# Patient Record
Sex: Male | Born: 1985 | Race: White | Hispanic: No | Marital: Married | Smoking: Current every day smoker
Health system: Southern US, Community
[De-identification: ages and names within clinical notes are randomized; demographics above are authoritative.]

## PROBLEM LIST (undated history)

## (undated) HISTORY — PX: HERNIA REPAIR: SHX51

---

## 2013-04-27 ENCOUNTER — Ambulatory Visit: Payer: Self-pay | Admitting: Internal Medicine

## 2013-04-27 VITALS — BP 140/90 | HR 74 | Temp 98.8°F | Resp 16 | Ht 72.25 in | Wt 220.0 lb

## 2013-04-27 DIAGNOSIS — Z0289 Encounter for other administrative examinations: Secondary | ICD-10-CM

## 2013-04-27 NOTE — Progress Notes (Signed)
Subjective:    Patient ID: Robert Roberson, male    DOB: [birth year], 27 y.o.   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 He appears well-developed and well-nourished.  HENT:  Head: Normocephalic.  Right Ear: External ear normal.  Left Ear: External ear normal.  Nose: Nose normal.  Mouth/Throat: Oropharynx is clear and moist.  Tms and canals clear  Eyes: Conjunctivae and EOM are normal. Pupils are equal, round, and reactive to light.  Neck: Normal range of motion. Neck supple. No thyromegaly present.  Cardiovascular: Normal rate, regular rhythm, normal heart sounds and intact distal pulses.   No murmur heard. Pulmonary/Chest: Effort normal and breath sounds normal. No respiratory distress. He has no wheezes. He has no rales.  Abdominal: Soft. Bowel sounds are normal. He exhibits no distension and no mass. There is no tenderness. There is no rebound and no guarding.  No hepatosplenomegaly  Musculoskeletal: Normal range of motion. He exhibits no edema and no tenderness.  Lymphadenopathy:    He has no cervical adenopathy.  Neurological: He is alert and oriented to person, place, and time. He has normal reflexes. No cranial nerve deficit. He exhibits normal muscle tone. Coordination normal.  Skin: Skin is warm and dry. No rash noted.  Psychiatric: He has a normal mood and affect. His behavior is normal. Judgment and thought content normal.   BP 140/90  Pulse 74  Temp(Src) 98.8 F (37.1 C) (Oral)  Resp 16  Ht 6' 0.25" (1.835 m)  Wt 220 lb (99.791 kg)  BMI 29.64 kg/m2  SpO2 99% Reck BP on outside and f/u if remains elevated       Assessment & Plan:  Admin PE  Tdap/TB  Tuberculosis Risk Questionnaire  1. No Were you born outside the [place] in one of the following parts of the  world: [place], [place], [place], [place] or [place]?no    2. No Have you traveled outside the [place] and lived for more than one month in one of the following parts of the world: [place], [place], [place], [place] or [place]?no    3. No Do you have a compromised immune system such as from any of the following conditions:HIV/AIDS, organ or bone marrow transplantation, diabetes, immunosuppressive medicines (e.g. Prednisone, Remicaide), leukemia, lymphoma, cancer of the head or neck, gastrectomy or jejunal bypass, end-stage renal disease (on dialysis), or silicosis?no     4. No Have you ever or do you plan on working in: a residential care center, a health care facility, a jail or prison or homeless shelter?no    5. No Have you ever: injected illegal drugs, used crack cocaine, lived in a homeless shelter  or been in jail or prison? no    6. No Have you ever been exposed to anyone with infectious tuberculosis?no    Tuberculosis Symptom Questionnaire  Do you currently have any of the following symptoms?  1. No Unexplained cough lasting more than 3 weeks? no  2. No Unexplained fever lasting more than 3 weeks. no  3. No Night Sweats (sweating that leaves the bedclothes and sheets wet) no    4. No Shortness of Breath no  5. No Chest Pain no  6. No Unintentional weight loss  no  7. No Unexplained fatigue (very tired for no  reason) no

## 2013-04-29 ENCOUNTER — Ambulatory Visit (INDEPENDENT_AMBULATORY_CARE_PROVIDER_SITE_OTHER): Payer: Self-pay | Admitting: *Deleted

## 2013-04-29 DIAGNOSIS — Z111 Encounter for screening for respiratory tuberculosis: Secondary | ICD-10-CM

## 2013-04-29 LAB — TB SKIN TEST: Induration: 0 mm

## 2020-03-24 ENCOUNTER — Other Ambulatory Visit: Payer: Self-pay

## 2020-03-24 ENCOUNTER — Emergency Department (HOSPITAL_COMMUNITY)
Admission: EM | Admit: 2020-03-24 | Discharge: 2020-03-25 | Disposition: A | Discharge status: Home / Self Care | Payer: BC Managed Care – PPO | Attending: Emergency Medicine | Admitting: Emergency Medicine

## 2020-03-24 ENCOUNTER — Emergency Department (HOSPITAL_COMMUNITY): Payer: BC Managed Care – PPO

## 2020-03-24 ENCOUNTER — Encounter (HOSPITAL_COMMUNITY): Payer: Self-pay

## 2020-03-24 DIAGNOSIS — T1490XA Injury, unspecified, initial encounter: Secondary | ICD-10-CM

## 2020-03-24 DIAGNOSIS — F17200 Nicotine dependence, unspecified, uncomplicated: Secondary | ICD-10-CM | POA: Insufficient documentation

## 2020-03-24 DIAGNOSIS — Y929 Unspecified place or not applicable: Secondary | ICD-10-CM | POA: Insufficient documentation

## 2020-03-24 DIAGNOSIS — M542 Cervicalgia: Secondary | ICD-10-CM | POA: Diagnosis not present

## 2020-03-24 DIAGNOSIS — Y9355 Activity, bike riding: Secondary | ICD-10-CM | POA: Insufficient documentation

## 2020-03-24 DIAGNOSIS — M546 Pain in thoracic spine: Secondary | ICD-10-CM | POA: Diagnosis not present

## 2020-03-24 DIAGNOSIS — S4991XA Unspecified injury of right shoulder and upper arm, initial encounter: Secondary | ICD-10-CM | POA: Diagnosis present

## 2020-03-24 DIAGNOSIS — Y999 Unspecified external cause status: Secondary | ICD-10-CM | POA: Insufficient documentation

## 2020-03-24 DIAGNOSIS — S43101A Unspecified dislocation of right acromioclavicular joint, initial encounter: Secondary | ICD-10-CM | POA: Diagnosis not present

## 2020-03-24 MED ORDER — CYCLOBENZAPRINE HCL 10 MG PO TABS
10.0000 mg | ORAL_TABLET | Freq: Once | ORAL | Status: AC
  Administered 2020-03-24: 10 mg via ORAL
  Filled 2020-03-24: qty 1

## 2020-03-24 MED ORDER — OXYCODONE-ACETAMINOPHEN 5-325 MG PO TABS
1.0000 | ORAL_TABLET | Freq: Once | ORAL | Status: AC
  Administered 2020-03-24: 13:00:00 1 via ORAL
  Filled 2020-03-24: qty 1

## 2020-03-24 NOTE — ED Notes (Signed)
Pt significant other came up to this tech and stated, "can he get some more pain meds?" Tech advised triage nurse.
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
## 2020-03-24 NOTE — ED Notes (Signed)
Called for vitals and no response 
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
## 2020-03-25 MED ORDER — KETOROLAC TROMETHAMINE 60 MG/2ML IM SOLN
30.0000 mg | Freq: Once | INTRAMUSCULAR | Status: AC
  Administered 2020-03-25: 30 mg via INTRAMUSCULAR
  Filled 2020-03-25: qty 2

## 2020-03-25 MED ORDER — OXYCODONE-ACETAMINOPHEN 5-325 MG PO TABS: 2.0000 | ORAL_TABLET | ORAL | 0 refills | Status: DC | PRN

## 2020-03-25 MED ORDER — IBUPROFEN 400 MG PO TABS: 400.0000 mg | ORAL_TABLET | Freq: Four times a day (QID) | ORAL | 0 refills | Status: AC

## 2020-03-25 MED ORDER — OXYCODONE-ACETAMINOPHEN 5-325 MG PO TABS
2.0000 | ORAL_TABLET | Freq: Once | ORAL | Status: AC
  Administered 2020-03-25: 2 via ORAL
  Filled 2020-03-25: qty 2
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
## 2020-03-25 NOTE — ED Notes (Signed)
Pt d/c by MD, Pt is provided w/ d.c instructions and follow up care, Pt is out of the ED ambulatory accompany by family
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
## 2021-12-26 ENCOUNTER — Telehealth: Payer: Self-pay | Admitting: Internal Medicine

## 2021-12-26 NOTE — Telephone Encounter (Signed)
Scheduled appt per 6/6 referral. Pt is aware of appt date and time. Pt is aware to arrive 15 mins prior to appt time and to bring and updated insurance card. Pt is aware of appt location.   

## 2022-01-03 ENCOUNTER — Other Ambulatory Visit: Payer: Self-pay

## 2022-01-03 ENCOUNTER — Other Ambulatory Visit: Payer: Self-pay | Admitting: Internal Medicine

## 2022-01-03 ENCOUNTER — Ambulatory Visit (HOSPITAL_COMMUNITY)
Admission: RE | Admit: 2022-01-03 | Discharge: 2022-01-03 | Disposition: A | Discharge status: Home / Self Care | Payer: BC Managed Care – PPO | Source: Ambulatory Visit | Attending: Internal Medicine | Admitting: Internal Medicine

## 2022-01-03 ENCOUNTER — Encounter: Payer: Self-pay | Admitting: Internal Medicine

## 2022-01-03 ENCOUNTER — Inpatient Hospital Stay: Payer: BC Managed Care – PPO

## 2022-01-03 ENCOUNTER — Inpatient Hospital Stay: Payer: BC Managed Care – PPO | Attending: Internal Medicine | Admitting: Internal Medicine

## 2022-01-03 VITALS — BP 125/84 | HR 89 | Temp 97.5°F | Resp 16 | Wt 199.3 lb

## 2022-01-03 DIAGNOSIS — Z8042 Family history of malignant neoplasm of prostate: Secondary | ICD-10-CM | POA: Diagnosis not present

## 2022-01-03 DIAGNOSIS — F419 Anxiety disorder, unspecified: Secondary | ICD-10-CM | POA: Insufficient documentation

## 2022-01-03 DIAGNOSIS — D72829 Elevated white blood cell count, unspecified: Secondary | ICD-10-CM

## 2022-01-03 DIAGNOSIS — Z8616 Personal history of COVID-19: Secondary | ICD-10-CM | POA: Diagnosis not present

## 2022-01-03 DIAGNOSIS — Z8249 Family history of ischemic heart disease and other diseases of the circulatory system: Secondary | ICD-10-CM | POA: Diagnosis not present

## 2022-01-03 DIAGNOSIS — R634 Abnormal weight loss: Secondary | ICD-10-CM | POA: Diagnosis not present

## 2022-01-03 DIAGNOSIS — R222 Localized swelling, mass and lump, trunk: Secondary | ICD-10-CM | POA: Insufficient documentation

## 2022-01-03 DIAGNOSIS — Z79899 Other long term (current) drug therapy: Secondary | ICD-10-CM | POA: Diagnosis not present

## 2022-01-03 DIAGNOSIS — R630 Anorexia: Secondary | ICD-10-CM | POA: Diagnosis not present

## 2022-01-03 DIAGNOSIS — R7402 Elevation of levels of lactic acid dehydrogenase (LDH): Secondary | ICD-10-CM | POA: Insufficient documentation

## 2022-01-03 DIAGNOSIS — F17210 Nicotine dependence, cigarettes, uncomplicated: Secondary | ICD-10-CM | POA: Diagnosis not present

## 2022-01-03 DIAGNOSIS — I10 Essential (primary) hypertension: Secondary | ICD-10-CM | POA: Diagnosis not present

## 2022-01-03 DIAGNOSIS — Z800 Family history of malignant neoplasm of digestive organs: Secondary | ICD-10-CM | POA: Insufficient documentation

## 2022-01-03 LAB — CBC WITH DIFFERENTIAL (CANCER CENTER ONLY)
Abs Immature Granulocytes: 0.03 10*3/uL (ref 0.00–0.07)
Basophils Absolute: 0.1 10*3/uL (ref 0.0–0.1)
Basophils Relative: 0 %
Eosinophils Absolute: 0.2 10*3/uL (ref 0.0–0.5)
Eosinophils Relative: 1 %
HCT: 44.2 % (ref 39.0–52.0)
Hemoglobin: 15.9 g/dL (ref 13.0–17.0)
Immature Granulocytes: 0 %
Lymphocytes Relative: 21 %
Lymphs Abs: 2.8 10*3/uL (ref 0.7–4.0)
MCH: 33.1 pg (ref 26.0–34.0)
MCHC: 36 g/dL (ref 30.0–36.0)
MCV: 91.9 fL (ref 80.0–100.0)
Monocytes Absolute: 0.7 10*3/uL (ref 0.1–1.0)
Monocytes Relative: 5 %
Neutro Abs: 9.7 10*3/uL — ABNORMAL HIGH (ref 1.7–7.7)
Neutrophils Relative %: 73 %
Platelet Count: 272 10*3/uL (ref 150–400)
RBC: 4.81 MIL/uL (ref 4.22–5.81)
RDW: 12.3 % (ref 11.5–15.5)
WBC Count: 13.3 10*3/uL — ABNORMAL HIGH (ref 4.0–10.5)
nRBC: 0 % (ref 0.0–0.2)

## 2022-01-03 LAB — CMP (CANCER CENTER ONLY)
ALT: 16 U/L (ref 0–44)
AST: 13 U/L — ABNORMAL LOW (ref 15–41)
Albumin: 4.7 g/dL (ref 3.5–5.0)
Alkaline Phosphatase: 57 U/L (ref 38–126)
Anion gap: 6 (ref 5–15)
BUN: 12 mg/dL (ref 6–20)
CO2: 30 mmol/L (ref 22–32)
Calcium: 9.8 mg/dL (ref 8.9–10.3)
Chloride: 105 mmol/L (ref 98–111)
Creatinine: 0.83 mg/dL (ref 0.61–1.24)
GFR, Estimated: >60 mL/min (ref >60)
Glucose, Bld: 105 mg/dL — ABNORMAL HIGH (ref 70–99)
Potassium: 4 mmol/L (ref 3.5–5.1)
Sodium: 141 mmol/L (ref 135–145)
Total Bilirubin: 0.5 mg/dL (ref 0.3–1.2)
Total Protein: 7.1 g/dL (ref 6.5–8.1)

## 2022-01-03 LAB — LACTATE DEHYDROGENASE: LDH: 156 U/L (ref 98–192)

## 2022-01-03 NOTE — Progress Notes (Signed)
[facility] [phone]   [phone]  CONSULT NOTE  REFERRING PHYSICIAN: Horton Marshall, PA  REASON FOR CONSULTATION:  36 years old white male for evaluation of leukocytosis  HPI Robert Roberson is a 36 y.o. male with past medical history significant for hypertension, vitamin D deficiency and history of COVID-19 infection in January 2022 as well as umbilical hernia repair and right shoulder surgery.  The patient has been going on to some marriage issues with separation recently.  He was seen by his primary care provider for routine evaluation and management of his blood pressure issue.  He had CBC performed 12/04/2021 and it showed elevated white blood count of 12.3 with normal hemoglobin of 15.7 and hematocrit 46.8% with normal platelets count of 265,000.  Repeat CBC on 12/21/2021 showed persistent leukocytosis with white blood count of 16.4 with absolute neutrophil count of 12,300.  The patient also has normal hemoglobin, hematocrit as well as platelet count. Because of the persistent leukocytosis the patient was referred to me today for evaluation and recommendation regarding his condition.  He has been going through a stressful condition and he smokes more recently.  He also drinks more beer.  He lost around 30 pounds in the last 2 months and he felt a lump in the lower sternal area after he lost that much weight.  He continues to have anxiety and poor appetite. He has no recurrent infection.  He is not on any steroids or hormonal treatment.  He takes multivitamins as well as vitamin D. The patient denied having any current chest pain, shortness of breath, cough or hemoptysis.  He has no nausea, vomiting, diarrhea or constipation.  He has no headache or visual changes.  He has no bleeding, bruises or ecchymosis. Family history significant for mother and father with hypertension.  His maternal grandfather had prostate cancer and paternal grandfather had liver  cancer. The patient is separated and has no children.  He works as an Art gallery manager.  He has a history for smoking 1.5 pack/day for around 17 years and he continues to smoke.  He also drinks several alcoholic beverages every day.  He also uses marijuana Gummies.  HPI  No past medical history on file.  Past Surgical History:  Procedure Laterality Date   HERNIA REPAIR      No family history on file.  Social History Social History   Tobacco Use   Smoking status: Every Day   Smokeless tobacco: Never  Substance Use Topics   Alcohol use: Yes   Drug use: Never    Allergies  Allergen Reactions   Allegra [Fexofenadine] Hives    Current Outpatient Medications  Medication Sig Dispense Refill   oxyCODONE-acetaminophen (PERCOCET) 5-325 MG tablet Take 2 tablets by mouth every 4 (four) hours as needed. 20 tablet 0   No current facility-administered medications for this visit.    Review of Systems  Constitutional: positive for weight loss Eyes: negative Ears, nose, mouth, throat, and face: negative Respiratory: negative Cardiovascular: negative Gastrointestinal: negative Genitourinary:negative Integument/breast: negative Hematologic/lymphatic: negative Musculoskeletal:negative Neurological: negative Behavioral/Psych: positive for anxiety Endocrine: negative Allergic/Immunologic: negative  Physical Exam  LFY:BOFBP, healthy, no distress, well nourished, well developed, and anxious SKIN: skin color, texture, turgor are normal, no rashes or significant lesions HEAD: Normocephalic, No masses, lesions, tenderness or abnormalities EYES: normal, PERRLA, Conjunctiva are pink and non-injected EARS: External ears normal, Canals clear OROPHARYNX:no exudate, no erythema, and lips, buccal mucosa, and tongue normal  NECK: supple, no adenopathy,  no JVD LYMPH:  no palpable lymphadenopathy, no hepatosplenomegaly BREAST:not examined LUNGS: clear to auscultation , and palpable xiphoid  process HEART: regular rate & rhythm, no murmurs, and no gallops ABDOMEN:abdomen soft, non-tender, normal bowel sounds, and no masses or organomegaly BACK: Back symmetric, no curvature., No CVA tenderness EXTREMITIES:no joint deformities, effusion, or inflammation, no edema  NEURO: alert & oriented x 3 with fluent speech, no focal motor/sensory deficits  PERFORMANCE STATUS: ECOG 0  LABORATORY DATA: Lab Results  Component Value Date   WBC 13.3 (H) 01/03/2022   HGB 15.9 01/03/2022   HCT 44.2 01/03/2022   MCV 91.9 01/03/2022   PLT 272 01/03/2022      Chemistry   No results found for: "NA", "K", "CL", "CO2", "BUN", "CREATININE", "GLU" No results found for: "CALCIUM", "ALKPHOS", "AST", "ALT", "BILITOT"     RADIOGRAPHIC STUDIES: DG Chest 1 View  Result Date: 01/03/2022 CLINICAL DATA:  Palpable nodule at the lower part of the sternum. Weight loss. EXAM: CHEST  1 VIEW COMPARISON:  March 24, 2020 FINDINGS: The heart, hila, mediastinum are normal. No pneumothorax. No nodules or masses. No focal infiltrates. No acute abnormalities. IMPRESSION: No active disease. Electronically Signed   By: Gerome Sam III M.D.   On: 01/03/2022 16:17    ASSESSMENT: This is a very pleasant 36 years old white male with mild leukocytosis likely reactive in nature secondary to smoking and also with stress but myeloproliferative disorder could not be ruled out.   PLAN: I had a lengthy discussion with the patient today about his condition and further investigation to rule out any underlying myeloproliferative disorder. Repeat CBC today showed mild leukocytosis with total white blood count of 13.3 with absolute neutrophil count of 9700.  He has normal hemoglobin of 12.9 and hematocrit 44.2% as well as platelet count of 272,000. Comprehensive metabolic panel is unremarkable except for mild elevation of blood glucose of 105 and low AST of 13 and LDH is within the normal range. I recommended for the patient to  have FISH study for BCR/ABL to rule out any underlying myeloproliferative disorder like chronic myeloid leukemia.  If the test is negative I would recommend for the patient to follow-up with his primary care provider with close monitoring. For the palpable epigastric lesion this is likely the xiphoid process that is palpable after he lost a lot of weight.  We will check a chest x-ray to rule out any abnormality in this area. His chest x-ray came back today with no concerning abnormalities. The patient was advised to call if he has any other concerning issues in the interval. The patient voices understanding of current disease status and treatment options and is in agreement with the current care plan.  All questions were answered. The patient knows to call the clinic with any problems, questions or concerns. We can certainly see the patient much sooner if necessary.  Thank you so much for allowing me to participate in the care of Robert Roberson. I will continue to follow up the patient with you and assist in his care.  The total time spent in the appointment was 60 minutes.  Disclaimer: This note was dictated with voice recognition software. Similar sounding words can inadvertently be transcribed and may not be corrected upon review.   Lajuana Matte January 03, 2022, 11:47 AM

## 2022-01-03 NOTE — Patient Instructions (Signed)
Steps to Quit Smoking Smoking tobacco is the leading cause of preventable death. It can affect almost every organ in the body. Smoking puts you and people around you at risk for many serious, long-lasting (chronic) diseases. Quitting smoking can be hard, but it is one of the best things that you can do for your health. It is never too late to quit. Do not give up if you cannot quit the first time. Some people need to try many times to quit. Do your best to stick to your quit plan, and talk with your doctor if you have any questions or concerns. How do I get ready to quit? Pick a date to quit. Set a date within the next 2 weeks to give you time to prepare. Write down the reasons why you are quitting. Keep this list in places where you will see it often. Tell your family, friends, and co-workers that you are quitting. Their support is important. Talk with your doctor about the choices that may help you quit. Find out if your health insurance will pay for these treatments. Know the people, places, things, and activities that make you want to smoke (triggers). Avoid them. What first steps can I take to quit smoking? Throw away all cigarettes at home, at work, and in your car. Throw away the things that you use when you smoke, such as ashtrays and lighters. Clean your car. Empty the ashtray. Clean your home, including curtains and carpets. What can I do to help me quit smoking? Talk with your doctor about taking medicines and seeing a counselor. You are more likely to succeed when you do both. If you are pregnant or breastfeeding: Talk with your doctor about counseling or other ways to quit smoking. Do not take medicine to help you quit smoking unless your doctor tells you to. Quit right away Quit smoking completely, instead of slowly cutting back on how much you smoke over a period of time. Stopping smoking right away may be more successful than slowly quitting. Go to counseling. In-person is best  if this is an option. You are more likely to quit if you go to counseling sessions regularly. Take medicine You may take medicines to help you quit. Some medicines need a prescription, and some you can buy over-the-counter. Some medicines may contain a drug called nicotine to replace the nicotine in cigarettes. Medicines may: Help you stop having the desire to smoke (cravings). Help to stop the problems that come when you stop smoking (withdrawal symptoms). Your doctor may ask you to use: Nicotine patches, gum, or lozenges. Nicotine inhalers or sprays. Non-nicotine medicine that you take by mouth. Find resources Find resources and other ways to help you quit smoking and remain smoke-free after you quit. They include: Online chats with a counselor. Phone quitlines. Printed self-help materials. Support groups or group counseling. Text messaging programs. Mobile phone apps. Use apps on your mobile phone or tablet that can help you stick to your quit plan. Examples of free services include Quit Guide from the CDC and smokefree.gov  What can I do to make it easier to quit?  Talk to your family and friends. Ask them to support and encourage you. Call a phone quitline, such as 1-800-QUIT-NOW, reach out to support groups, or work with a counselor. Ask people who smoke to not smoke around you. Avoid places that make you want to smoke, such as: Bars. Parties. Smoke-break areas at work. Spend time with people who do not smoke. Lower   the stress in your life. Stress can make you want to smoke. Try these things to lower stress: Getting regular exercise. Doing deep-breathing exercises. Doing yoga. Meditating. What benefits will I see if I quit smoking? Over time, you may have: A better sense of smell and taste. Less coughing and sore throat. A slower heart rate. Lower blood pressure. Clearer skin. Better breathing. Fewer sick days. Summary Quitting smoking can be hard, but it is one of  the best things that you can do for your health. Do not give up if you cannot quit the first time. Some people need to try many times to quit. When you decide to quit smoking, make a plan to help you succeed. Quit smoking right away, not slowly over a period of time. When you start quitting, get help and support to keep you smoke-free. This information is not intended to replace advice given to you by your health care provider. Make sure you discuss any questions you have with your health care provider. Document Revised: 06/29/2021 Document Reviewed: 06/29/2021 Elsevier Patient Education  2023 [employer].  

## 2022-01-09 LAB — BCR ABL1 FISH (GENPATH)

## 2022-10-24 IMAGING — DX DG CHEST 1V
1 series · 1 of 1 positions shown · non-contrast
Comparison: March 24, 2020

CLINICAL DATA: Palpable nodule at the lower part of the sternum.
Weight loss.

EXAM:
CHEST  1 VIEW

[chest pa]
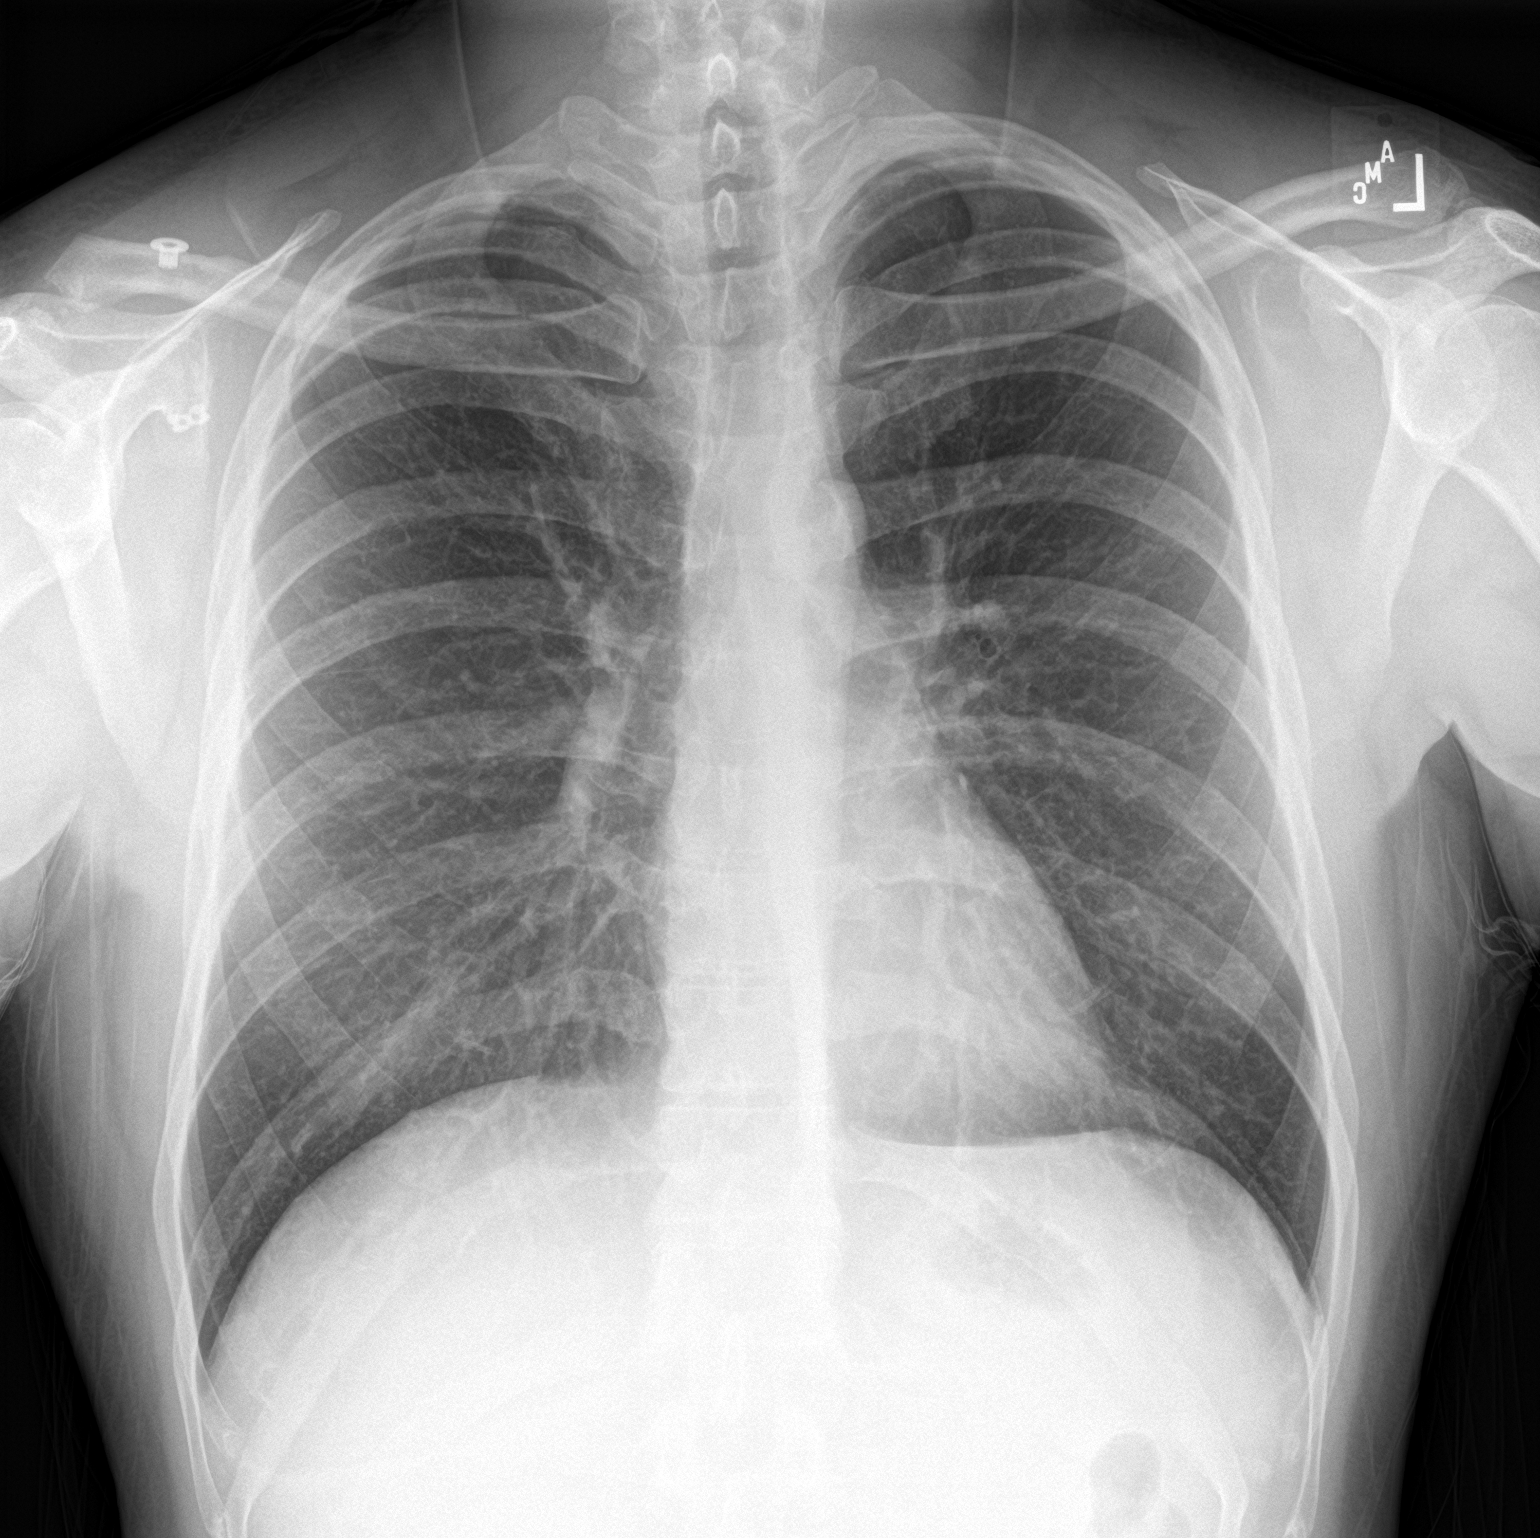

[1 of 1 positions shown; findings below may reference images not displayed]

FINDINGS: The heart, hila, mediastinum are normal. No pneumothorax. No nodules
or masses. No focal infiltrates. No acute abnormalities.
IMPRESSION: No active disease.
